# Patient Record
Sex: Female | Born: 1995 | Race: White | Hispanic: No | Smoking: Never smoker
Health system: Southern US, Community
[De-identification: ages and names within clinical notes are randomized; demographics above are authoritative.]

---
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
## 2018-12-05 ENCOUNTER — Other Ambulatory Visit: Payer: Self-pay | Admitting: Obstetrics and Gynecology

## 2018-12-05 ENCOUNTER — Other Ambulatory Visit (HOSPITAL_COMMUNITY)
Admission: RE | Admit: 2018-12-05 | Discharge: 2018-12-05 | Disposition: A | Discharge status: Home / Self Care | Payer: 59 | Source: Ambulatory Visit | Attending: Obstetrics and Gynecology | Admitting: Obstetrics and Gynecology

## 2018-12-05 DIAGNOSIS — Z01419 Encounter for gynecological examination (general) (routine) without abnormal findings: Secondary | ICD-10-CM | POA: Diagnosis not present

## 2018-12-08 LAB — CYTOLOGY - PAP: Diagnosis: NEGATIVE

## 2019-01-14 ENCOUNTER — Other Ambulatory Visit: Payer: Self-pay

## 2019-01-14 DIAGNOSIS — Z20822 Contact with and (suspected) exposure to covid-19: Secondary | ICD-10-CM

## 2019-01-15 LAB — NOVEL CORONAVIRUS, NAA: SARS-CoV-2, NAA: NOT DETECTED

## 2022-09-29 ENCOUNTER — Ambulatory Visit
Admission: EM | Admit: 2022-09-29 | Discharge: 2022-09-29 | Disposition: A | Discharge status: Home / Self Care | Payer: 59 | Attending: Family Medicine | Admitting: Family Medicine

## 2022-09-29 DIAGNOSIS — S80862A Insect bite (nonvenomous), left lower leg, initial encounter: Secondary | ICD-10-CM

## 2022-09-29 DIAGNOSIS — S80861A Insect bite (nonvenomous), right lower leg, initial encounter: Secondary | ICD-10-CM | POA: Diagnosis not present

## 2022-09-29 DIAGNOSIS — R21 Rash and other nonspecific skin eruption: Secondary | ICD-10-CM | POA: Diagnosis not present

## 2022-09-29 DIAGNOSIS — W57XXXA Bitten or stung by nonvenomous insect and other nonvenomous arthropods, initial encounter: Secondary | ICD-10-CM

## 2022-09-29 MED ORDER — PREDNISONE 20 MG PO TABS: 40.0000 mg | ORAL_TABLET | Freq: Every day | ORAL | 0 refills | Status: AC

## 2022-09-29 MED ORDER — CLOBETASOL PROPIONATE 0.05 % EX CREA: 1.0000 | TOPICAL_CREAM | Freq: Two times a day (BID) | CUTANEOUS | 0 refills | Status: DC

## 2022-09-29 NOTE — ED Triage Notes (Signed)
Pt reports [place] and now has red raised bumps on the back of her legs that are itching and burning x 4 days.  Tried calamine lotion,hydrocortisone cream, benadryl cream but only temp relief.

## 2022-10-01 NOTE — ED Provider Notes (Signed)
  [facility]   [id number] 09/29/22 Arrival Time: 0806  ASSESSMENT & PLAN:  1. Rash and nonspecific skin eruption   2. Insect bite of left lower leg, initial encounter   3. Insect bite of right lower leg, initial encounter    No signs of bacterial skin infection. Question ant bites.  Meds ordered this encounter  Medications   clobetasol cream (TEMOVATE) 0.05 %    Sig: Apply 1 Application topically 2 (two) times daily.    Dispense:  45 g    Refill:  0   predniSONE (DELTASONE) 20 MG tablet    Sig: Take 2 tablets (40 mg total) by mouth daily.    Dispense:  10 tablet    Refill:  0    Will follow up with PCP or here if worsening or failing to improve as anticipated. Reviewed expectations re: course of current medical issues. Questions answered. Outlined signs and symptoms indicating need for more acute intervention. Patient verbalized understanding. After Visit Summary given.   SUBJECTIVE:  Leah Watson is a 27 y.o. female who presents with a skin complaint. Pt reports she has been around [place] and now has red raised bumps on the back of her legs that are itching and burning x 4 days.  Tried calamine lotion,hydrocortisone cream, benadryl cream but only temp relief.  Denies fever.     OBJECTIVE: Vitals:   09/29/22 0815  BP: 136/80  Pulse: 83  Resp: 18  Temp: 98.2 F (36.8 C)  TempSrc: Oral  SpO2: 98%    General appearance: alert; no distress HEENT: [place]; AT Neck: supple with FROM Extremities: no edema; moves all extremities normally Skin: warm and dry; scattered raised erythematous indurations and sterile blisters over bilateral posterior lower legs at knees Psychological: alert and cooperative; normal mood and affect  No Known Allergies  History reviewed. No pertinent past medical history. Social History   Socioeconomic History   Marital status: Single    Spouse name: Not on file   Number of children: Not on file   Years of education: Not on  file   Highest education level: Not on file  Occupational History   Not on file  Tobacco Use   Smoking status: Never   Smokeless tobacco: Never  Vaping Use   Vaping Use: Never used  Substance and Sexual Activity   Alcohol use: Yes   Drug use: Never   Sexual activity: Yes  Other Topics Concern   Not on file  Social History Narrative   Not on file   Social Determinants of Health   Financial Resource Strain: Not on file  Food Insecurity: Not on file  Transportation Needs: Not on file  Physical Activity: Not on file  Stress: Not on file  Social Connections: Not on file  Intimate Partner Violence: Not on file   History reviewed. No pertinent family history. History reviewed. No pertinent surgical history.    Mardella Layman, MD 10/01/22 [phone]

## 2024-03-30 DIAGNOSIS — F419 Anxiety disorder, unspecified: Secondary | ICD-10-CM | POA: Diagnosis not present

## 2024-04-06 DIAGNOSIS — F419 Anxiety disorder, unspecified: Secondary | ICD-10-CM | POA: Diagnosis not present

## 2024-04-20 ENCOUNTER — Other Ambulatory Visit (HOSPITAL_COMMUNITY)
Admission: RE | Admit: 2024-04-20 | Discharge: 2024-04-20 | Disposition: A | Source: Ambulatory Visit | Attending: Obstetrics & Gynecology | Admitting: Obstetrics & Gynecology

## 2024-04-20 ENCOUNTER — Ambulatory Visit (INDEPENDENT_AMBULATORY_CARE_PROVIDER_SITE_OTHER): Admitting: Adult Health

## 2024-04-20 ENCOUNTER — Encounter: Payer: Self-pay | Admitting: Adult Health

## 2024-04-20 VITALS — BP 111/77 | HR 94 | Ht 67.0 in | Wt 206.5 lb

## 2024-04-20 DIAGNOSIS — F418 Other specified anxiety disorders: Secondary | ICD-10-CM

## 2024-04-20 DIAGNOSIS — F32A Depression, unspecified: Secondary | ICD-10-CM | POA: Insufficient documentation

## 2024-04-20 DIAGNOSIS — Z975 Presence of (intrauterine) contraceptive device: Secondary | ICD-10-CM | POA: Diagnosis not present

## 2024-04-20 DIAGNOSIS — L918 Other hypertrophic disorders of the skin: Secondary | ICD-10-CM

## 2024-04-20 DIAGNOSIS — Z01419 Encounter for gynecological examination (general) (routine) without abnormal findings: Secondary | ICD-10-CM

## 2024-04-20 DIAGNOSIS — Z1151 Encounter for screening for human papillomavirus (HPV): Secondary | ICD-10-CM | POA: Insufficient documentation

## 2024-04-20 NOTE — Progress Notes (Signed)
 Patient ID: Leah Watson, female   DOB: 1995/11/27, 29 y.o.   MRN: [id number] History of Present Illness: Leah Watson is a 29 year old white female, with SO, G0P0, in for a well woman gyn exam and pap. She has mirena IUD for last 5.5 years and wants to change to [facility]. She works in [employer].   Current Medications, Allergies, Past Medical History, Past Surgical History, Family History and Social History were reviewed in Leah Watson record.     Review of Systems: Patient denies any hearing loss, fatigue, blurred vision, shortness of breath, chest pain, abdominal pain, problems with bowel movements, urination, or intercourse. No joint pain or mood swings.  Has had headaches recently, thinks it is tension,has had stress   Physical Exam:BP 111/77 (BP Location: Right Arm, Patient Position: Sitting, Cuff Size: Large)   Pulse 94   Ht 5' 7 (1.702 m)   Wt 206 lb 8 oz (93.7 kg)   LMP 04/15/2024 (Exact Date)   BMI 32.34 kg/m   General:  Well developed, well nourished, no acute distress Skin:  Warm and dry Neck:  Midline trachea, normal thyroid, good ROM, no lymphadenopathy Lungs; Clear to auscultation bilaterally Breast:  No dominant palpable mass, retraction, or nipple discharge Cardiovascular: Regular rate and rhythm Abdomen:  Soft, non tender, no hepatosplenomegaly Pelvic:  External genitalia is normal in appearance, has skin tag right inner thigh and skin tag vs wart left inner thigh, has had wart removed in about 2019.   The vagina is normal in appearance. Urethra has no lesions or masses. The cervix is smooth,+IUD strings at os, pap with HR HPV genotyping performed.  Uterus is felt to be normal size, shape, and contour.  No adnexal masses or tenderness noted.Bladder is non tender, no masses felt. Extremities/musculoskeletal:  No swelling or varicosities noted, no clubbing or cyanosis Psych:  No mood changes, alert and cooperative,seems happy AA is 3 Fall risk is low     04/20/2024   10:53 AM  Depression screen PHQ 2/9  Decreased Interest 1  Down, Depressed, Hopeless 1  PHQ - 2 Score 2  Altered sleeping 2  Tired, decreased energy 3  Change in appetite 2  Feeling bad or failure about yourself  2  Trouble concentrating 2  Moving slowly or fidgety/restless 1  Suicidal thoughts 0  PHQ-9 Score 14   Seeing therapist    04/20/2024   10:54 AM  GAD 7 : Generalized Anxiety Score  Nervous, Anxious, on Edge 3  Control/stop worrying 2  Worry too much - different things 2  Trouble relaxing 2  Restless 2  Easily annoyed or irritable 2  Afraid - awful might happen 2  Total GAD 7 Score 15    Upstream - 04/20/24 1052       Pregnancy Intention Screening   Does the patient want to become pregnant in the next year? No    Does the patient's partner want to become pregnant in the next year? No    Would the patient like to discuss contraceptive options today? Yes      Contraception Wrap Up   Current Method IUD or IUS    End Method IUD or IUS    Contraception Counseling Provided Yes           Examination chaperoned by Leah Salt LPN   Impression and plan: 1. Encounter for gynecological examination with Papanicolaou smear of cervix (Primary) Pap sent Pap in 3 years if negative Physical in 1 year  -  Cytology - PAP( [facility])  2. IUD (intrauterine device) in place Mirena in place,has had 5.5 years and wants Leah Watson now Return 05/15/24 and see Leah Watson for IUD removal and reinsertion of Leah Watson   3. Skin tag Will get removal when IUD replaced 05/15/24  4. Anxiety and depression She is seeing therapist

## 2024-04-23 ENCOUNTER — Ambulatory Visit: Payer: Self-pay | Admitting: Adult Health

## 2024-04-23 LAB — CYTOLOGY - PAP
Comment: NEGATIVE
Diagnosis: NEGATIVE
High risk HPV: NEGATIVE

## 2024-05-15 ENCOUNTER — Encounter: Payer: Self-pay | Admitting: Obstetrics & Gynecology

## 2024-05-15 ENCOUNTER — Other Ambulatory Visit (HOSPITAL_COMMUNITY)
Admission: RE | Admit: 2024-05-15 | Discharge: 2024-05-15 | Disposition: A | Discharge status: Home / Self Care | Source: Ambulatory Visit | Attending: Obstetrics & Gynecology | Admitting: Obstetrics & Gynecology

## 2024-05-15 ENCOUNTER — Ambulatory Visit: Admitting: Obstetrics & Gynecology

## 2024-05-15 VITALS — BP 124/79 | HR 101 | Ht 67.0 in | Wt 207.0 lb

## 2024-05-15 DIAGNOSIS — Z30430 Encounter for insertion of intrauterine contraceptive device: Secondary | ICD-10-CM

## 2024-05-15 DIAGNOSIS — N843 Polyp of vulva: Secondary | ICD-10-CM

## 2024-05-15 DIAGNOSIS — Z30432 Encounter for removal of intrauterine contraceptive device: Secondary | ICD-10-CM

## 2024-05-15 DIAGNOSIS — A630 Anogenital (venereal) warts: Secondary | ICD-10-CM | POA: Insufficient documentation

## 2024-05-15 DIAGNOSIS — D225 Melanocytic nevi of trunk: Secondary | ICD-10-CM

## 2024-05-15 DIAGNOSIS — Z30433 Encounter for removal and reinsertion of intrauterine contraceptive device: Secondary | ICD-10-CM

## 2024-05-15 MED ORDER — PARAGARD INTRAUTERINE COPPER IU IUD
1.0000 | INTRAUTERINE_SYSTEM | Freq: Once | INTRAUTERINE | Status: AC
  Administered 2024-05-15: 1 via INTRAUTERINE

## 2024-05-15 NOTE — Progress Notes (Signed)
 "  GYN VISIT Patient name: Leah Watson MRN [id number]  Date of birth: 09-01-1995 Chief Complaint:   Contraception  History of Present Illness:   Leah Watson is a 29 y.o. G0P0000 female being seen today for the following:  -IUD removal and replacement-patient currently has Mirena for contraception and desires to switch to nonhormonal ParaGard .  She does not have any issues with the Mirena and currently does not have a period with this device.  Prior to being on birth control, she does note moderate to heavy menses and is aware that her periods will likely return.  -Genital warts/Skin tags: Bilateral groin area notes lesion that rubs and is very irritating to her.  She also notes they have slightly increased in size which is because of the worsening irritation.   Patient's last menstrual period was 04/15/2024 (exact date).    Review of Systems:   Pertinent items are noted in HPI Denies fever/chills, dizziness, headaches, visual disturbances, fatigue, shortness of breath, chest pain, abdominal pain, vomiting, no problems with periods, bowel movements, urination, or intercourse unless otherwise stated above.  Pertinent History Reviewed:  History reviewed. No pertinent surgical history.  History reviewed. No pertinent past medical history. Reviewed problem list, medications and allergies. Physical Assessment:   Vitals:   05/15/24 1117  BP: 124/79  Pulse: (!) 101  Weight: 207 lb (93.9 kg)  Height: 5' 7 (1.702 m)  Body mass index is 32.42 kg/m.       Physical Examination:   General appearance: alert, well appearing, and in no distress  Psych: mood appropriate, normal affect  Skin: warm & dry   Cardiovascular: normal heart rate noted  Respiratory: normal respiratory effort, no distress  Abdomen: soft, non-tender   Pelvic: Left groin with 3 mm genital wart with narrow base, right groin with 1 cm wide based genital wart.  No other external abnormalities noted-normal  external genitalia.  Vaginal pink moist mucosa with no abnormal discharge or lesions.  Cervix easily visualized with no lesions or bleeding.  Strings visualized at cervical os.  Extremities: no edema    Time out was performed.  A sterile speculum was placed in the vagina.  The cervix was visualized, prepped using Betadine. The strings were visible. They were grasped and the Mirena IUD was easily removed. The cervix was then grasped with a single-tooth tenaculum. The uterus was found to be anteroflexed and it sounded to 8 cm.  Paragard   IUD placed per manufacturer's recommendations without complications. The strings were trimmed to approximately 3 cm.  The patient tolerated the procedure well.   Informal transvaginal sonogram was performed and the proper placement of the IUD was verified.   After IUD procedure.  Attention was then turned to the genital warts.  Each area was cleaned with Betadine.  Each area was then injected with approximately 3 cc of 1% lidocaine.  Attention was then turned to the left lesion.  Using pickup and scissors an elliptical incision was then made to remove the genital warts.  A single silk stitch was placed.  A similar procedure was completed on the right side.  Excellent hemostasis was obtained.  Patient tolerated procedure well without complications  Chaperone: Aleck Blase  Assessment & Plan:   1) Mirena IUD removal & Paragard  insertion The patient was given post procedure instructions, including signs and symptoms of infection and to check for the strings after each menses or each month, and refraining from intercourse or anything in the vagina for 3  days. She was given a care card with date IUD placed, and date IUD to be removed. She is scheduled for a f/u appointment in 6-8 weeks  2) Genital warts - Both lesions were removed without difficulty and sent to pathology - Postop care instructions were given - Patient to follow-up next week for stitch  removal  No orders of the defined types were placed in this encounter.   Follow-up: Return in about 1 week (around 05/22/2024) for suture removal then 6-8wk IUD check up.  Aryav Wimberly, DO Attending Obstetrician & Gynecologist, [facility], [facility]    "

## 2024-05-18 LAB — SURGICAL PATHOLOGY

## 2024-05-20 ENCOUNTER — Ambulatory Visit: Admitting: Adult Health

## 2024-05-20 ENCOUNTER — Encounter: Payer: Self-pay | Admitting: Adult Health

## 2024-05-20 VITALS — BP 115/83 | HR 83 | Ht 67.0 in | Wt 207.0 lb

## 2024-05-20 DIAGNOSIS — Z4802 Encounter for removal of sutures: Secondary | ICD-10-CM | POA: Insufficient documentation

## 2024-05-20 NOTE — Progress Notes (Signed)
" °  Subjective:     Patient ID: Leah Watson, female   DOB: 12-Aug-1995, 29 y.o.   MRN: [id number]  HPI Leah Watson is a 29 year old white female with SO, G0P0, in for suture removal had 2 lesions removed 05/15/24 when had mirena removed and ParaGard  inserted with Dr Ozan. 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      Component Value Date/Time   DIAGPAP  04/20/2024 1056    - Negative for intraepithelial lesion or malignancy (NILM)   DIAGPAP  12/05/2018 0000    NEGATIVE FOR INTRAEPITHELIAL LESIONS OR MALIGNANCY.   DIAGPAP  12/05/2018 0000    A LETTER WAS SENT TO THE PATIENT INFORMING HER OF THE ABOVE RESULTS.   HPVHIGH Negative 04/20/2024 1056   ADEQPAP  04/20/2024 1056    Satisfactory for evaluation; transformation zone component PRESENT.   ADEQPAP  12/05/2018 0000    Satisfactory for evaluation  endocervical/transformation zone component PRESENT.     Review of Systems For suture removal Reviewed past medical,surgical, social and family history. Reviewed medications and allergies.     Objective:   Physical Exam BP 115/83 (BP Location: Left Arm, Patient Position: Sitting, Cuff Size: Large)   Pulse 83   Ht 5' 7 (1.702 m)   Wt 207 lb (93.9 kg)   LMP 05/18/2024 (Approximate)   BMI 32.42 kg/m     Skin is warm and dry, used iris scissors to clip suture and easily removed from each area.  Upstream - 05/20/24 1117       Pregnancy Intention Screening   Does the patient want to become pregnant in the next year? No    Does the patient's partner want to become pregnant in the next year? No    Would the patient like to discuss contraceptive options today? No      Contraception Wrap Up   Current Method IUD or IUS    End Method IUD or IUS    Contraception Counseling Provided Yes         Examination chaperoned by Clarita Salt LPN  Assessment:     1. Encounter for removal of sutures (Primary) Sutures removed, edges together      Plan:     Return in 6 weeks  for IUD check     "

## 2024-07-01 ENCOUNTER — Ambulatory Visit: Admitting: Adult Health
